# Patient Record
Sex: Male | Born: 1970 | Race: Black or African American | Hispanic: No | Marital: Married
Health system: Southern US, Community
[De-identification: ages and names within clinical notes are randomized; demographics above are authoritative.]

---

## 2017-07-20 ENCOUNTER — Other Ambulatory Visit: Payer: Self-pay

## 2017-07-20 ENCOUNTER — Emergency Department: Payer: Medicaid Other

## 2017-07-20 ENCOUNTER — Emergency Department
Admission: EM | Admit: 2017-07-20 | Discharge: 2017-07-20 | Disposition: A | Discharge status: Home / Self Care | Payer: Medicaid Other | Attending: Emergency Medicine | Admitting: Emergency Medicine

## 2017-07-20 DIAGNOSIS — R079 Chest pain, unspecified: Secondary | ICD-10-CM | POA: Diagnosis present

## 2017-07-20 DIAGNOSIS — Z5321 Procedure and treatment not carried out due to patient leaving prior to being seen by health care provider: Secondary | ICD-10-CM | POA: Diagnosis not present

## 2017-07-20 LAB — BASIC METABOLIC PANEL
Anion gap: 5 (ref 5–15)
BUN: 57 mg/dL — AB (ref 6–20)
CO2: 26 mmol/L (ref 22–32)
CREATININE: 2.4 mg/dL — AB (ref 0.61–1.24)
Calcium: 8.8 mg/dL — ABNORMAL LOW (ref 8.9–10.3)
Chloride: 107 mmol/L (ref 101–111)
GFR calc Af Amer: 36 mL/min — ABNORMAL LOW (ref >60)
GFR, EST NON AFRICAN AMERICAN: 31 mL/min — AB (ref >60)
Glucose, Bld: 141 mg/dL — ABNORMAL HIGH (ref 65–99)
POTASSIUM: 4.6 mmol/L (ref 3.5–5.1)
SODIUM: 138 mmol/L (ref 135–145)

## 2017-07-20 LAB — CBC
HCT: 34.5 % — ABNORMAL LOW (ref 40.0–52.0)
Hemoglobin: 11.5 g/dL — ABNORMAL LOW (ref 13.0–18.0)
MCH: 29.2 pg (ref 26.0–34.0)
MCHC: 33.3 g/dL (ref 32.0–36.0)
MCV: 87.6 fL (ref 80.0–100.0)
PLATELETS: 278 10*3/uL (ref 150–440)
RBC: 3.94 MIL/uL — ABNORMAL LOW (ref 4.40–5.90)
RDW: 15.3 % — AB (ref 11.5–14.5)
WBC: 9.4 10*3/uL (ref 3.8–10.6)

## 2017-07-20 LAB — TROPONIN I: Troponin I: <0.03 ng/mL (ref <0.03)

## 2017-07-20 NOTE — ED Triage Notes (Signed)
Pt c/o chest pain that radiates down the left arm that started this morning, pt is in NAD at present, skin is warm and dry. Respirations WNL. Pt states he has a HX of 8 cardiac stents.Marland Kitchen

## 2019-10-27 IMAGING — CR DG CHEST 2V
2 series · 2 of 2 positions shown · non-contrast
Comparison: None.

CLINICAL DATA: Chest pain radiating to the left arm today, history
of prior placement of cardiac stents

EXAM:
CHEST - 2 VIEW

[chest pa]
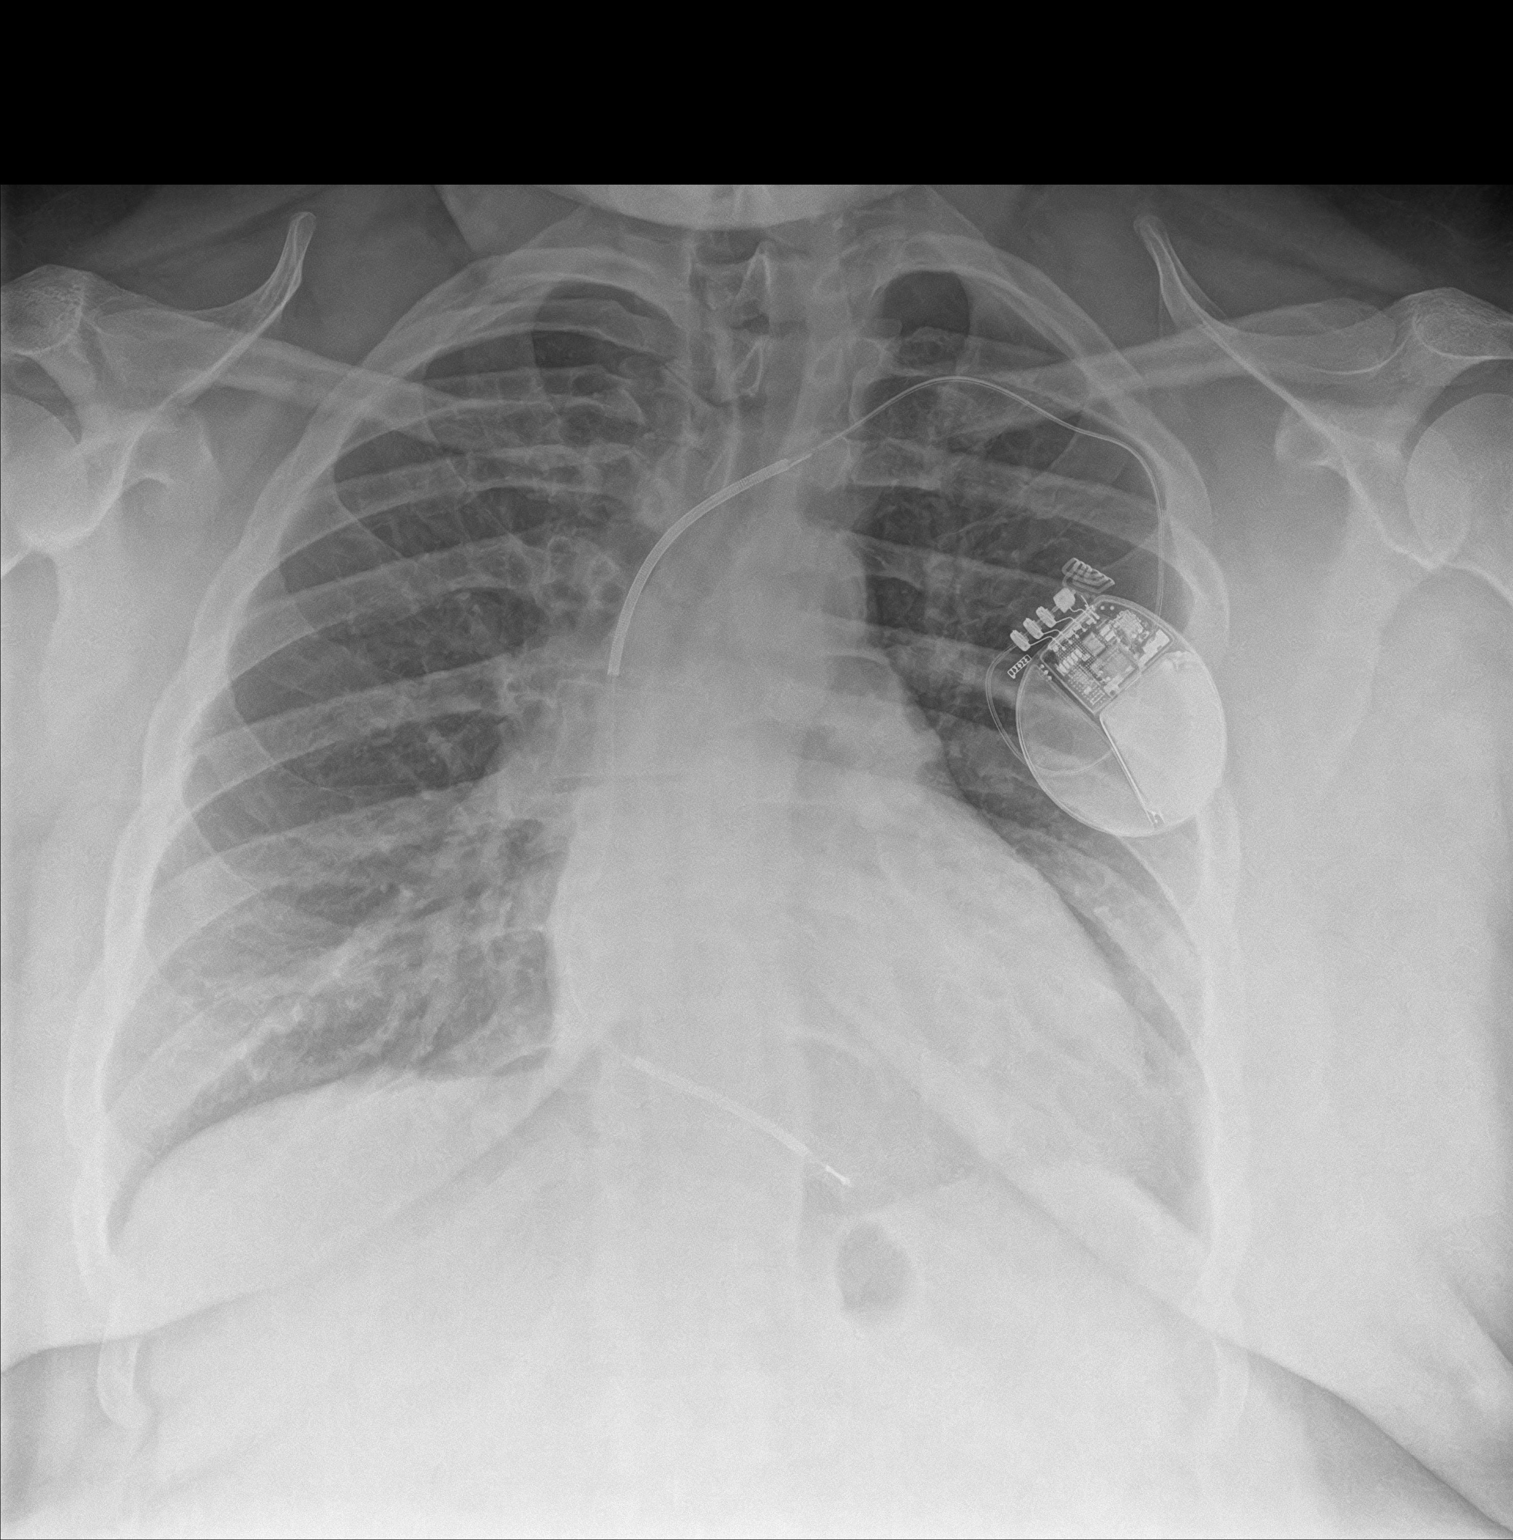

[chest lat]
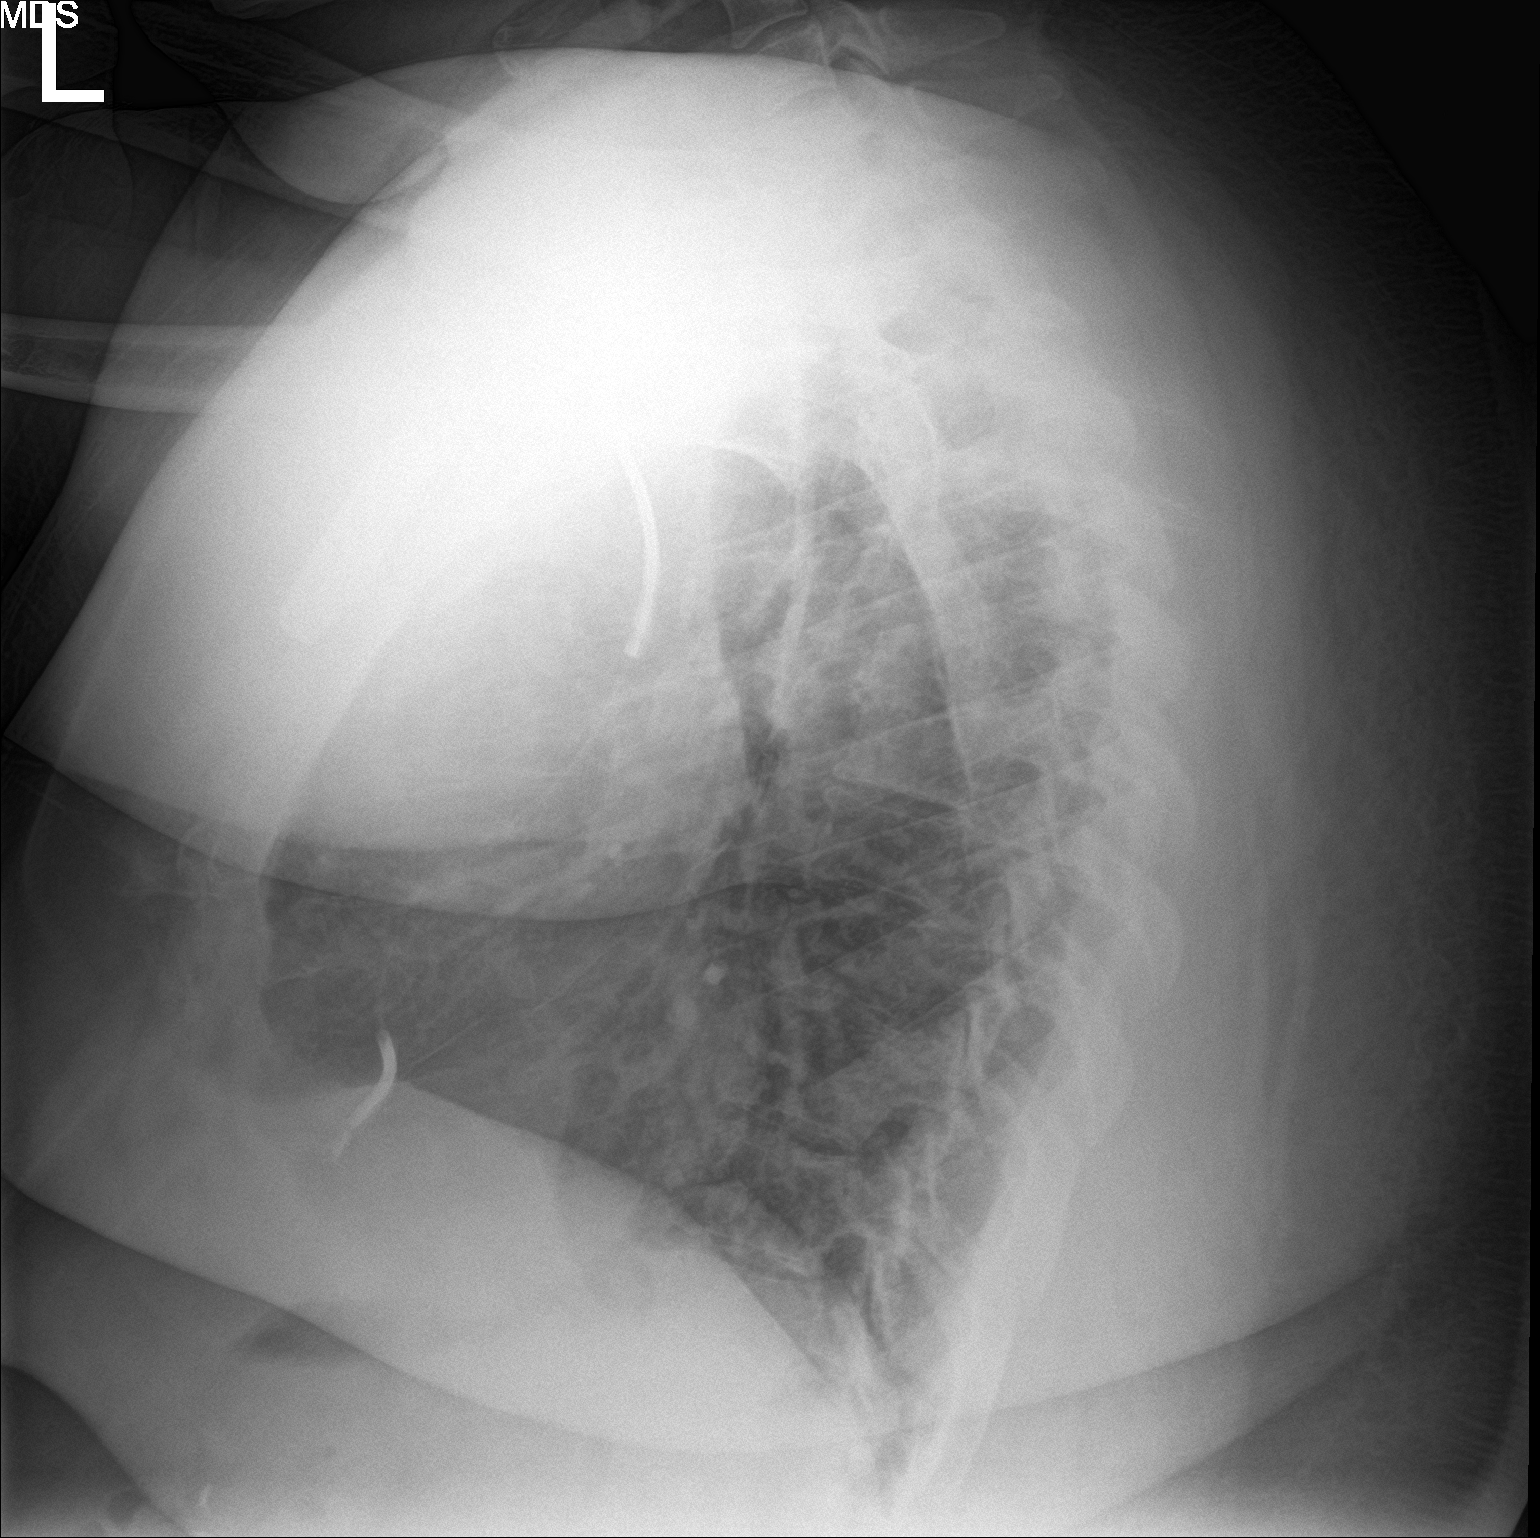

[2 of 2 positions shown; findings below may reference images not displayed]

FINDINGS: The lungs are not optimally aerated but no pneumonia or pleural
effusion is seen. Mediastinal and hilar contours are unremarkable.
The heart is within upper limits normal. AICD lead is present. No
bony abnormality is noted.
IMPRESSION: 1. Suboptimal inspiration.  No active lung disease.
2. AICD leads are present.  Borderline cardiomegaly.

## 2021-11-14 DEATH — deceased
